# Patient Record
Sex: Male | Born: 1980 | Race: White | Hispanic: No | Marital: Married | Smoking: Never smoker
Health system: Southern US, Community
[De-identification: ages and names within clinical notes are randomized; demographics above are authoritative.]

## PROBLEM LIST (undated history)

## (undated) HISTORY — PX: NOSE SURGERY: SHX723

---

## 2008-08-01 ENCOUNTER — Ambulatory Visit: Payer: Self-pay | Admitting: Diagnostic Radiology

## 2008-08-01 ENCOUNTER — Ambulatory Visit (HOSPITAL_BASED_OUTPATIENT_CLINIC_OR_DEPARTMENT_OTHER): Admission: RE | Admit: 2008-08-01 | Discharge: 2008-08-01 | Payer: Self-pay | Admitting: Family Medicine

## 2011-05-23 ENCOUNTER — Emergency Department (INDEPENDENT_AMBULATORY_CARE_PROVIDER_SITE_OTHER): Payer: BC Managed Care – PPO

## 2011-05-23 ENCOUNTER — Encounter (HOSPITAL_BASED_OUTPATIENT_CLINIC_OR_DEPARTMENT_OTHER): Payer: Self-pay | Admitting: *Deleted

## 2011-05-23 ENCOUNTER — Emergency Department (HOSPITAL_BASED_OUTPATIENT_CLINIC_OR_DEPARTMENT_OTHER)
Admission: EM | Admit: 2011-05-23 | Discharge: 2011-05-23 | Disposition: A | Discharge status: Home Health | Payer: BC Managed Care – PPO | Attending: Emergency Medicine | Admitting: Emergency Medicine

## 2011-05-23 DIAGNOSIS — S93129A Dislocation of metatarsophalangeal joint of unspecified toe(s), initial encounter: Secondary | ICD-10-CM | POA: Insufficient documentation

## 2011-05-23 DIAGNOSIS — W219XXA Striking against or struck by unspecified sports equipment, initial encounter: Secondary | ICD-10-CM | POA: Insufficient documentation

## 2011-05-23 DIAGNOSIS — S93104A Unspecified dislocation of right toe(s), initial encounter: Secondary | ICD-10-CM

## 2011-05-23 DIAGNOSIS — Y9239 Other specified sports and athletic area as the place of occurrence of the external cause: Secondary | ICD-10-CM | POA: Insufficient documentation

## 2011-05-23 DIAGNOSIS — Z09 Encounter for follow-up examination after completed treatment for conditions other than malignant neoplasm: Secondary | ICD-10-CM

## 2011-05-23 DIAGNOSIS — X58XXXA Exposure to other specified factors, initial encounter: Secondary | ICD-10-CM

## 2011-05-23 DIAGNOSIS — M79643 Pain in unspecified hand: Secondary | ICD-10-CM

## 2011-05-23 DIAGNOSIS — M79609 Pain in unspecified limb: Secondary | ICD-10-CM | POA: Insufficient documentation

## 2011-05-23 DIAGNOSIS — Y9366 Activity, soccer: Secondary | ICD-10-CM | POA: Insufficient documentation

## 2011-05-23 MED ORDER — MORPHINE SULFATE 4 MG/ML IJ SOLN
4.0000 mg | Freq: Once | INTRAMUSCULAR | Status: AC
  Administered 2011-05-23: 4 mg via INTRAMUSCULAR
  Filled 2011-05-23: qty 1

## 2011-05-23 MED ORDER — HYDROCODONE-ACETAMINOPHEN 5-500 MG PO TABS: 1.0000 | ORAL_TABLET | Freq: Four times a day (QID) | ORAL | Status: AC | PRN

## 2011-05-23 NOTE — Discharge Instructions (Signed)
Toe Dislocation Toe dislocation is the displacement of the large bone of your toe (metatarsal) from the socket that connects it to your foot. Very strong, fibrous tissues (ligaments) connect your toe to a bone in your foot and stabilize the joint where these 2 bones meet. Dislocation is caused by a forceful impact to your toe. This impact moves your toe off the joint and often injures the ligaments that support it. SYMPTOMS Symptoms of toe dislocation include:  Noticeable deformity of your toe.   Pain, with loss of movement.   Looseness in your joint, indicating a tear of the ligaments (severe dislocations).  DIAGNOSIS Toe dislocation is diagnosed through a physical exam. Often, X-ray exams are done to see if you have associated injuries, such as bone fractures. TREATMENT Toe dislocations are treated by putting your bones back into position (reduction) either by manipulation or through surgery. The toe is then kept in a fixed position (immobilized) in a cast, splint, or rigid postoperative shoe. In rare cases, surgical repair of a ligament is required, followed by immobilization of the toe.  HOME CARE INSTRUCTIONS The following measures can help to reduce pain and speed up the healing process:  Rest your injured joint. Do not move it. Avoid activities similar to the one that caused your injury.   Apply ice to your injured joint for 1 to 2 days after your reduction or as directed by your caregiver. Applying ice helps to reduce inflammation and pain.   Put ice in a plastic bag.   Place a towel between your skin and the bag.   Leave the ice on for 15 to 20 minutes at a time, every 2 hours while you are awake.   Elevate your foot above your heart as directed by your caregiver.   Take over-the-counter or perscription medicine for pain as directed by your caregiver.   If your caregiver has taped your toe to an adjacent toe, change the tape as directed by your caregiver.  SEEK IMMEDIATE  MEDICAL CARE IF:  Your cast or splint becomes loose or damaged.   Your pain becomes worse, not better.   You lose feeling in your toe, or you cannot bend the tip of your toe.  MAKE SURE YOU:  Understand these instructions.   Will watch your condition.   Will get help right away if you are not doing well or get worse.  Document Released: 12/09/2000 Document Revised: 11/26/2010 Document Reviewed: 08/14/2010 [facility] Patient Information 2012 [place], [place].

## 2011-05-23 NOTE — ED Notes (Addendum)
Injured left foot and right hand while playing indoor soccer- pedal pulse present, cap refill <3 sec-

## 2011-05-23 NOTE — ED Provider Notes (Signed)
History     CSN: [id number]  Arrival date & time 05/23/11  1901   First MD Initiated Contact with Patient 05/23/11 1922      Chief Complaint  Patient presents with  . Foot Injury  . Hand Injury    (Consider location/radiation/quality/duration/timing/severity/associated sxs/prior treatment) HPI Comments: Pt was playing indoor soccer and hit his foot against the wall:pt c/o hand pain  Patient is a 31 y.o. male presenting with foot injury. The history is provided by the patient. No language interpreter was used.  Foot Injury  The incident occurred 1 to 2 hours ago. Incident location: playing indoor soccer. The injury mechanism was a direct blow. The pain is present in the left foot (right hand). The quality of the pain is described as aching. The pain is moderate. The pain has been constant since onset. Pertinent negatives include no loss of motion. He reports no foreign bodies present. He has tried nothing for the symptoms.    History reviewed. No pertinent past medical history.  Past Surgical History  Procedure Date  . Nose surgery     No family history on file.  History  Substance Use Topics  . Smoking status: Never Smoker   . Smokeless tobacco: Former Neurosurgeon  . Alcohol Use: 16.2 oz/week    27 Cans of beer per week      Review of Systems  All other systems reviewed and are negative.    Allergies  Review of patient's allergies indicates no known allergies.  Home Medications   Current Outpatient Rx  Name Route Sig Dispense Refill  . ADULT MULTIVITAMIN W/MINERALS CH Oral Take 1 tablet by mouth daily.      BP 134/64  Pulse 87  Temp(Src) 99.2 F (37.3 C) (Oral)  Resp 18  Ht 5\' 9"  (1.753 m)  Wt 175 lb (79.379 kg)  BMI 25.84 kg/m2  SpO2 98%  Physical Exam  Nursing note and vitals reviewed. Constitutional: He is oriented to person, place, and time. He appears well-developed and well-nourished.  HENT:  Head: Normocephalic and atraumatic.  Eyes: EOM are  normal.  Neck: Neck supple.  Cardiovascular: Normal rate and regular rhythm.   Pulmonary/Chest: Effort normal and breath sounds normal.  Musculoskeletal:       No swelling or deformity to the right hand:pt has obvious deformity to left first and second toe  Neurological: He is alert and oriented to person, place, and time.  Skin: Skin is warm and dry.  Psychiatric: He has a normal mood and affect.    ED Course  Reduction of dislocation Performed by: Teressa Lower Authorized by: Teressa Lower Consent: Verbal consent obtained. Written consent not obtained. Risks and benefits: risks, benefits and alternatives were discussed Consent given by: patient Patient understanding: patient states understanding of the procedure being performed Patient identity confirmed: verbally with patient Time out: Immediately prior to procedure a "time out" was called to verify the correct patient, procedure, equipment, support staff and site/side marked as required. Local anesthesia used: no Patient sedated: no Patient tolerance: Patient tolerated the procedure well with no immediate complications. Comments: Pt given pain medication:toes reduced without any problem:pt tolerating procedure without any problem:left first and second toe reduced   (including critical care time)  Labs Reviewed - No data to display Dg Hand Complete Right  05/23/2011  *RADIOLOGY REPORT*  Clinical Data: 31 year old male with pain.  RIGHT HAND - COMPLETE 3+ VIEW  Comparison: None.  Findings: Bone mineralization is within normal limits.  Distal radius  and ulna appear intact.  Small ossific fragment dorsal to the carpal bones, appears relatively corticated.  Joint spaces preserved.  No other osseous abnormality identified in the right hand.  IMPRESSION: Chronic-appearing ossific fragment dorsal to the carpal bones probably reflects triquetrum fracture.  Otherwise no fracture or dislocation identified in the right hand.  Original  Report Authenticated By: Harley Hallmark, M.D.   Dg Foot Complete Left  05/23/2011  *RADIOLOGY REPORT*  Clinical Data: 31 year old male with pain.  LEFT FOOT - COMPLETE 3+ VIEW  Comparison: None.  Findings: Dorsal and slightly medial dislocation of the left first MTP joint.  Mild overriding.  There also is a medial and dorsal dislocation of the second MTP joint.  No associated fracture at the site.  Other joint spaces appear preserved and within normal limits.  Calcaneus intact.  IMPRESSION: Acute dorsal and medial dislocations of the first and second metatarsal phalangeal joints.  No definite associated acute fracture.  Original Report Authenticated By: Ulla Potash III, M.D.     1. Dislocation of second toe, right, closed   2. Dislocation of great toe, right, closed   3. Hand pain       MDM  Toe reduced:pt is feeling better at this time        Teressa Lower, NP 05/23/11 2059

## 2011-05-25 NOTE — ED Provider Notes (Signed)
Medical screening examination/treatment/procedure(s) were performed by non-physician practitioner and as supervising physician I was immediately available for consultation/collaboration.  Toy Baker, MD 05/25/11 2232

## 2012-07-05 IMAGING — CR DG FOOT COMPLETE 3+V*L*
3 series · 3 of 3 positions shown · non-contrast
Comparison: 5412 hours the same day.

CLINICAL DATA: 30-year-old male status post reduction.

LEFT FOOT - COMPLETE 3+ VIEW

[t foot ap left]
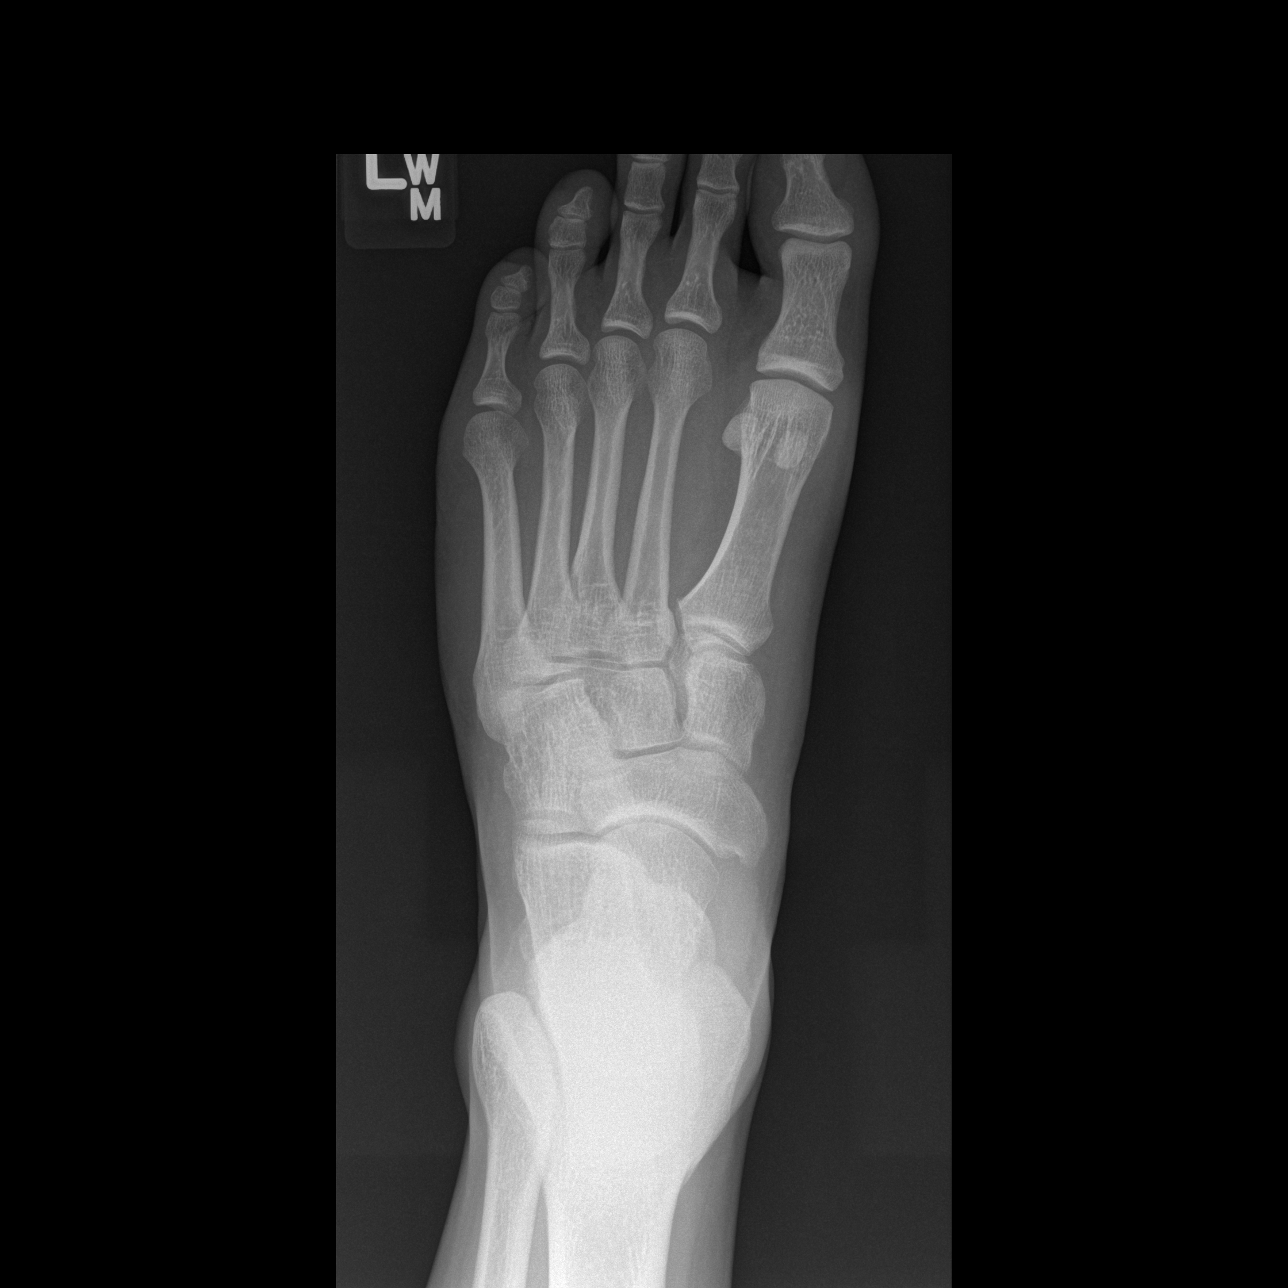

[t foot oblique left]
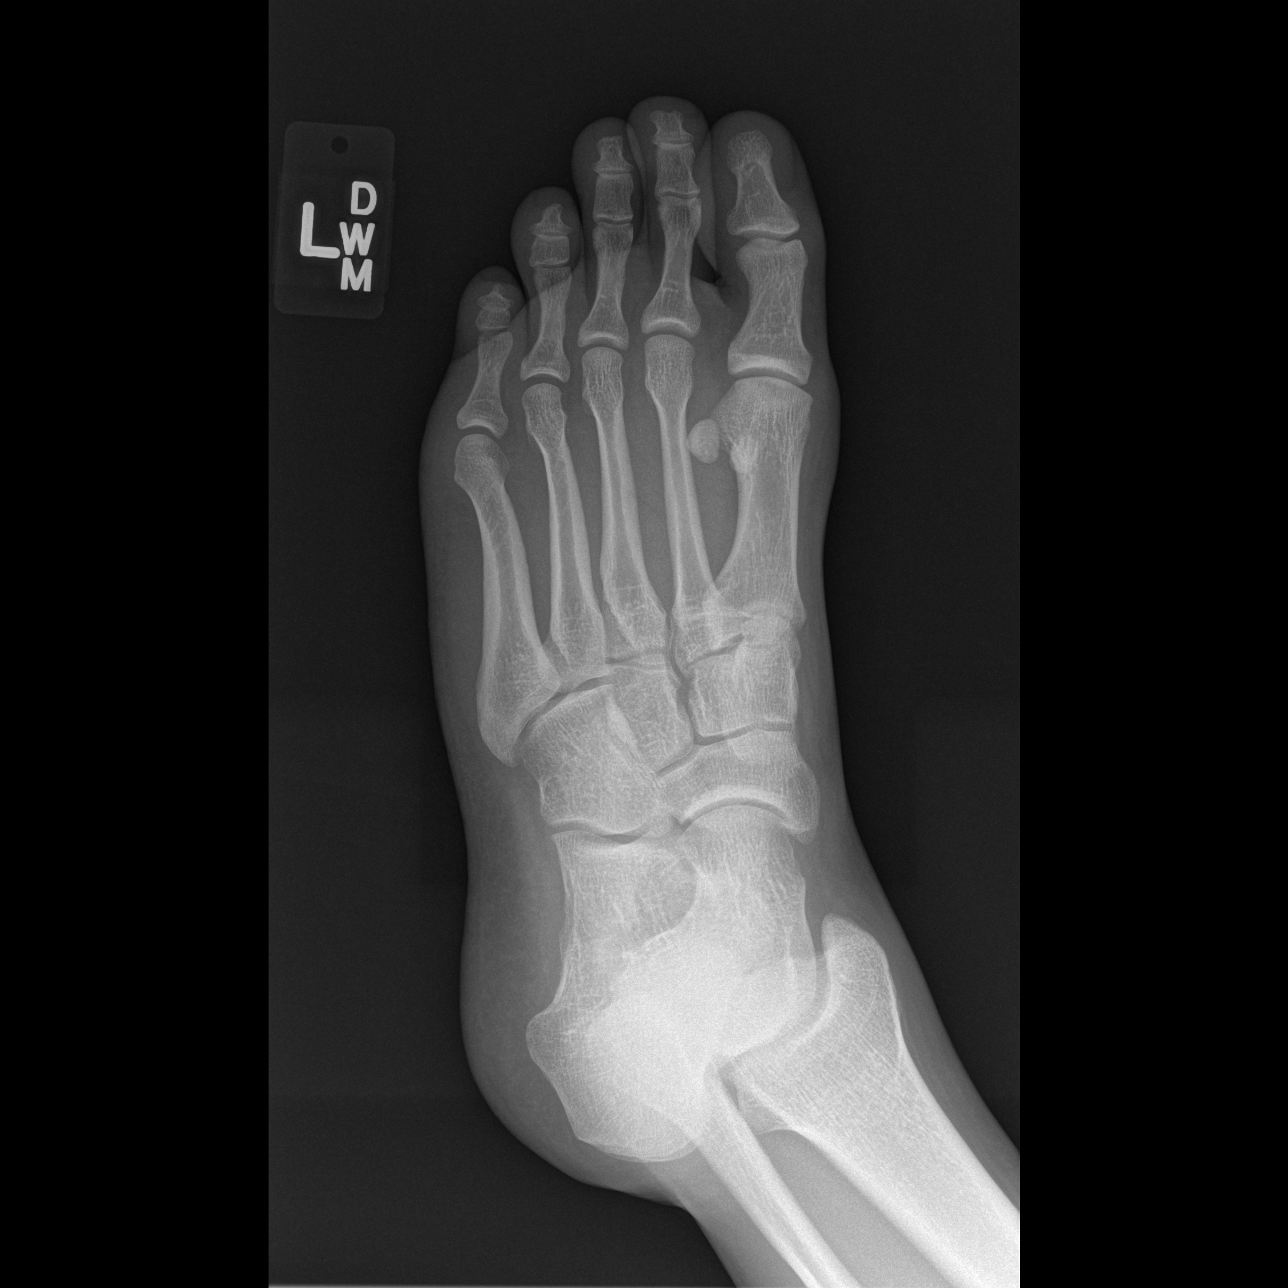

[t foot lat left]
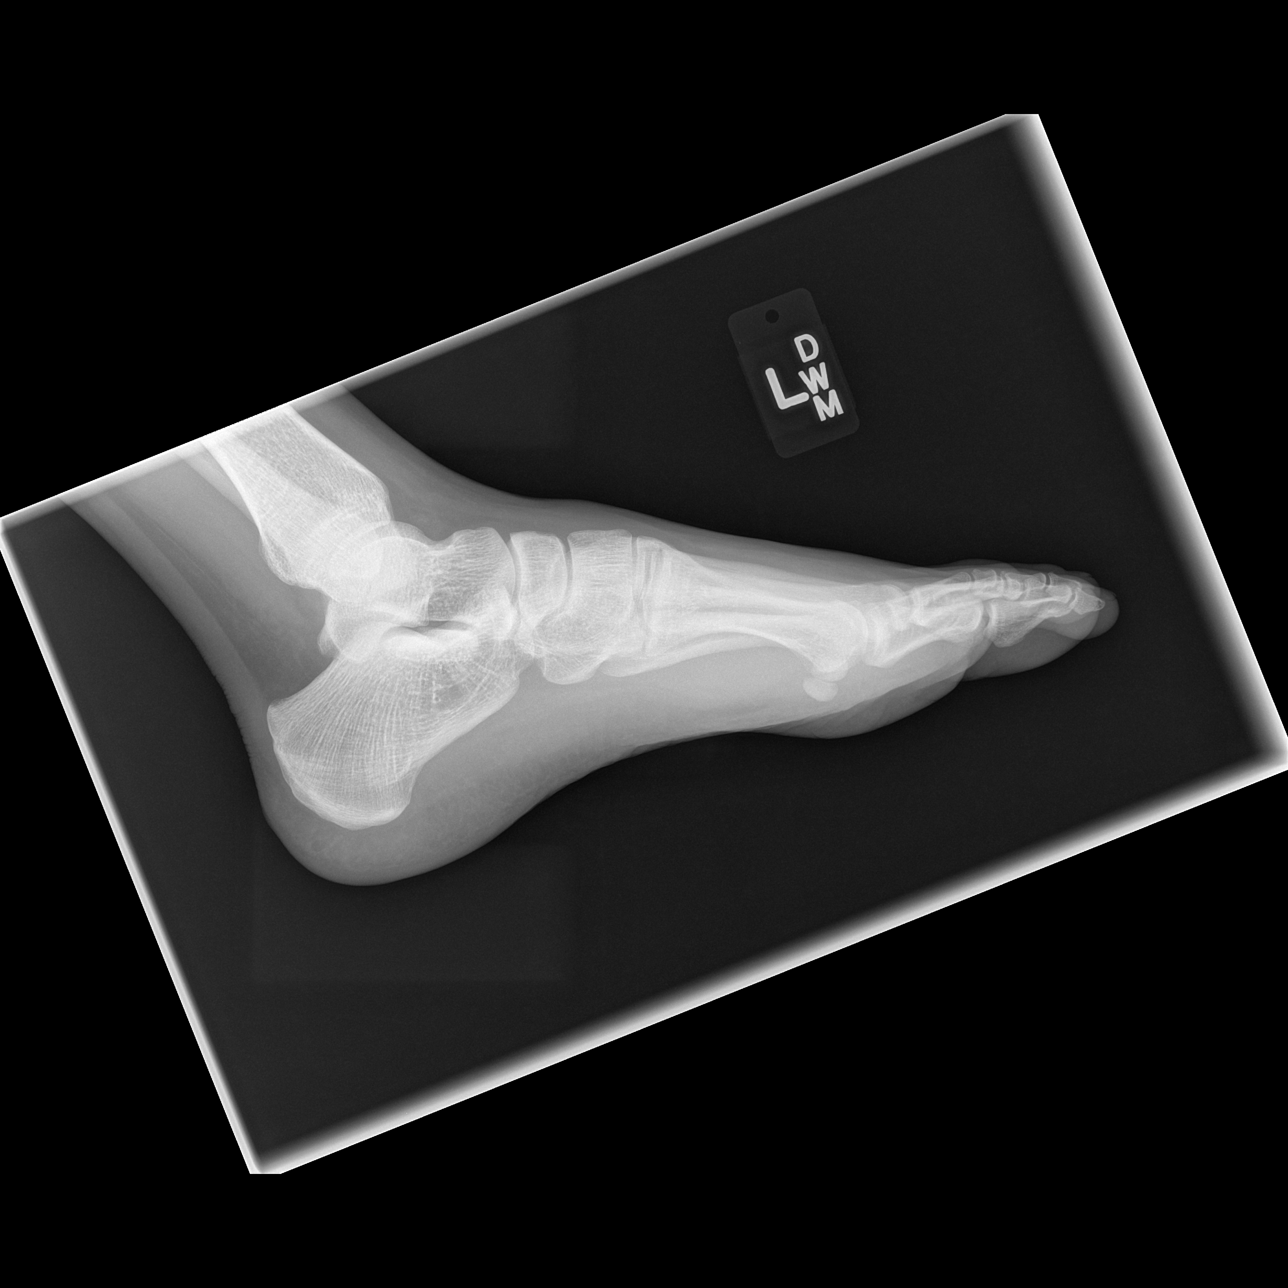

[3 of 3 positions shown; findings below may reference images not displayed]

FINDINGS: The left first and second MTP dislocations have been
reduced.  Alignment is satisfactory.  Joint spaces are preserved.
No fracture is identified.  Other osseous structures are stable
within normal limits.
IMPRESSION: Reduced first and second MTP dislocations.  No associated fractures
identified.

## 2020-09-13 ENCOUNTER — Encounter (HOSPITAL_BASED_OUTPATIENT_CLINIC_OR_DEPARTMENT_OTHER): Payer: Self-pay | Admitting: *Deleted

## 2020-09-13 ENCOUNTER — Other Ambulatory Visit: Payer: Self-pay

## 2020-09-13 ENCOUNTER — Emergency Department (HOSPITAL_BASED_OUTPATIENT_CLINIC_OR_DEPARTMENT_OTHER)
Admission: EM | Admit: 2020-09-13 | Discharge: 2020-09-13 | Disposition: A | Discharge status: Home / Self Care | Payer: 59 | Attending: Emergency Medicine | Admitting: Emergency Medicine

## 2020-09-13 ENCOUNTER — Emergency Department (HOSPITAL_BASED_OUTPATIENT_CLINIC_OR_DEPARTMENT_OTHER): Payer: 59

## 2020-09-13 DIAGNOSIS — T148XXA Other injury of unspecified body region, initial encounter: Secondary | ICD-10-CM

## 2020-09-13 DIAGNOSIS — Y9301 Activity, walking, marching and hiking: Secondary | ICD-10-CM | POA: Insufficient documentation

## 2020-09-13 DIAGNOSIS — Z87891 Personal history of nicotine dependence: Secondary | ICD-10-CM | POA: Diagnosis not present

## 2020-09-13 DIAGNOSIS — W01198A Fall on same level from slipping, tripping and stumbling with subsequent striking against other object, initial encounter: Secondary | ICD-10-CM | POA: Diagnosis not present

## 2020-09-13 DIAGNOSIS — Y92007 Garden or yard of unspecified non-institutional (private) residence as the place of occurrence of the external cause: Secondary | ICD-10-CM | POA: Diagnosis not present

## 2020-09-13 DIAGNOSIS — S8992XA Unspecified injury of left lower leg, initial encounter: Secondary | ICD-10-CM | POA: Diagnosis present

## 2020-09-13 DIAGNOSIS — S90511A Abrasion, right ankle, initial encounter: Secondary | ICD-10-CM | POA: Insufficient documentation

## 2020-09-13 DIAGNOSIS — S8012XA Contusion of left lower leg, initial encounter: Secondary | ICD-10-CM | POA: Diagnosis not present

## 2020-09-13 NOTE — ED Provider Notes (Signed)
[facility] EMERGENCY DEPARTMENT Provider Note   CSN: [id number] Arrival date & time: 09/13/20  1853     History Chief Complaint  Patient presents with   Leg Injury    Samuel Craig is a 40 y.o. male.  40 year old male presents with complaint of left leg injury.  Patient states that he was walking in his yard inspecting a tree limb that had fallen in a recent storm when he slipped and fell striking his left anterior lower leg on the limb resulting in an abrasion to the leg with significant pain in the area.  Also reports minor abrasion to his right lateral ankle. Patient has crutches with him today.  Last tetanus was less than 5 years ago.  No other injuries, complaints, concerns      History reviewed. No pertinent past medical history.  There are no problems to display for this patient.   Past Surgical History:  Procedure Laterality Date   NOSE SURGERY         History reviewed. No pertinent family history.  Social History   Tobacco Use   Smoking status: Never   Smokeless tobacco: Former  Building services engineer Use: Never used  Substance Use Topics   Alcohol use: Yes    Alcohol/week: 27.0 standard drinks    Types: 27 Cans of beer per week   Drug use: No    Home Medications Prior to Admission medications   Medication Sig Start Date End Date Taking? Authorizing Provider  Multiple Vitamin (MULITIVITAMIN WITH MINERALS) TABS Take 1 tablet by mouth daily.    [provider]    Allergies    Patient has no known allergies.  Review of Systems   Review of Systems  Constitutional:  Negative for fever.  Musculoskeletal:  Positive for gait problem. Negative for arthralgias and myalgias.  Skin:  Positive for wound.  Allergic/Immunologic: Negative for immunocompromised state.  Neurological:  Negative for weakness and numbness.  Hematological:  Negative for adenopathy.  Psychiatric/Behavioral:  Negative for confusion.    Physical Exam Updated Vital  Signs BP 130/84   Pulse 84   Temp 98.2 F (36.8 C) (Oral)   Resp 16   Ht 5\' 9"  (1.753 m)   Wt 95.3 kg   SpO2 98%   BMI 31.01 kg/m   Physical Exam Vitals and nursing note reviewed.  Constitutional:      General: He is not in acute distress.    Appearance: He is well-developed. He is not diaphoretic.  HENT:     Head: Normocephalic and atraumatic.  Cardiovascular:     Pulses: Normal pulses.  Pulmonary:     Effort: Pulmonary effort is normal.  Musculoskeletal:        General: Swelling, tenderness and signs of injury present. No deformity.       Legs:     Comments: Swelling with tenderness to anterior proximal left lower leg.  No knee injury. Minor abrasion to right lateral ankle without bony tenderness or swelling.  Skin:    General: Skin is warm and dry.     Findings: Bruising present. No erythema or rash.  Neurological:     Mental Status: He is alert and oriented to person, place, and time.     Sensory: No sensory deficit.     Motor: No weakness.  Psychiatric:        Behavior: Behavior normal.    ED Results / Procedures / Treatments   Labs (all labs ordered are listed, but  only abnormal results are displayed) Labs Reviewed - No data to display  EKG None  Radiology DG Tibia/Fibula Left  Result Date: 09/13/2020 CLINICAL DATA:  Puncture wound EXAM: LEFT TIBIA AND FIBULA - 2 VIEW COMPARISON:  None. FINDINGS: There is no evidence of fracture or other focal bone lesions. Edema within the anterior subcutaneous soft tissues of the proximal lower leg without radiopaque foreign body IMPRESSION: No acute osseous abnormality Electronically Signed   By: Jasmine Pang M.D.   On: 09/13/2020 19:30    Procedures Procedures   Medications Ordered in ED Medications - No data to display  ED Course  I have reviewed the triage vital signs and the nursing notes.  Pertinent labs & imaging results that were available during my care of the patient were reviewed by me and considered  in my medical decision making (see chart for details).  Clinical Course as of 09/13/20 2002  Fri Sep 13, 2020  [id number] 40 year old male with left lower leg pain after falling a tree limb today.  Found to have contusion and abrasion to the left lower leg as well as abrasion to the right lateral ankle.  Tetanus is up-to-date.  X-ray is negative for bony injury.  Patient can use his crutches to weight-bear as tolerated, apply antibiotic ointment to leg as needed as well as ice, Motrin, Tylenol, follow-up with sports medicine for pain not improving or other concerns. [LM]    Clinical Course User Index [LM] Alden Hipp   MDM Rules/Calculators/A&P                           Final Clinical Impression(s) / ED Diagnoses Final diagnoses:  Contusion of left lower extremity, initial encounter  Abrasion    Rx / DC Orders ED Discharge Orders     None        Alden Hipp 09/13/20 2002    Terald Sleeper, MD 09/14/20 1100

## 2020-09-13 NOTE — Discharge Instructions (Addendum)
Motrin and Tylenol as needed as directed. Apply ice to the area for 20 minutes at a time and elevate to help with swelling. Follow-up with sports medicine if not improving, referral given.

## 2020-09-13 NOTE — ED Triage Notes (Signed)
C/o left leg injury x 1 hr ago , fall onto tree limb
